# Patient Record
Sex: Male | Born: 2000 | Race: White | Hispanic: No | Marital: Single | Smoking: Never smoker
Health system: Southern US, Community
[De-identification: ages and names within clinical notes are randomized; demographics above are authoritative.]

## PROBLEM LIST (undated history)

## (undated) DIAGNOSIS — T7840XA Allergy, unspecified, initial encounter: Secondary | ICD-10-CM

## (undated) HISTORY — DX: Allergy, unspecified, initial encounter: T78.40XA

---
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
## 2019-10-09 ENCOUNTER — Ambulatory Visit
Admission: RE | Admit: 2019-10-09 | Discharge: 2019-10-09 | Disposition: A | Discharge status: Home / Self Care | Payer: 59 | Source: Ambulatory Visit | Attending: Family Medicine | Admitting: Family Medicine

## 2019-10-09 ENCOUNTER — Ambulatory Visit: Payer: 59 | Admitting: Family Medicine

## 2019-10-09 ENCOUNTER — Other Ambulatory Visit: Payer: Self-pay

## 2019-10-09 VITALS — BP 122/74 | Ht 75.0 in | Wt 210.0 lb

## 2019-10-09 DIAGNOSIS — M25512 Pain in left shoulder: Secondary | ICD-10-CM

## 2019-10-09 NOTE — Patient Instructions (Signed)
You at minimum subluxed (near-dislocated) your left shoulder and bruised the labrum. Get x-rays after today's visit - if you have a chipped bone that would change our management of this. Given it's your non-pitching arm, if x-rays are normal I would recommend you do 4-6 weeks of rehab with a labrum tear protocol back at school. I'd expect after this you should have no instability and the sharp pains you're getting should be gone. Icing, tylenol or aleve only if you need them. Let me know how you're doing in about a month (you can send me a message through [place] given you'll be in [place]).

## 2019-10-10 ENCOUNTER — Encounter: Payer: Self-pay | Admitting: Family Medicine

## 2019-10-10 NOTE — Progress Notes (Signed)
PCP: Sydell Axon, MD  Subjective:   HPI: Patient is a 18 y.o. male here for left shoulder injury.  Patient reports about 10 days ago he was at [employer] golf with his friends. He reports he was swinging for distance when he felt like his left shoulder popped out of place. This is not happened to him before but he was with a friend who has had a history of shoulder dislocations who helped him pop it back in. Since that time he has improved but still gets shooting pains at times with certain movements including reaching overhead. He is not taking any medications for this. 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 No skin changes or swelling.  No bruising. Pain is currently 0 out of 10 at rest.  Past Medical History:  Diagnosis Date  . Allergy     No current outpatient medications on file prior to visit.   No current facility-administered medications on file prior to visit.    History reviewed. No pertinent surgical history.  Allergies  Allergen Reactions  . Fish-Derived Products Hives  . Lac Bovis Other (See Comments)    Social History   Socioeconomic History  . Marital status: Single    Spouse name: Not on file  . Number of children: Not on file  . Years of education: Not on file  . Highest education level: Not on file  Occupational History  . Not on file  Tobacco Use  . Smoking status: Never Smoker  . Smokeless tobacco: Never Used  Substance and Sexual Activity  . Alcohol use: No    Alcohol/week: 0.0 standard drinks  . Drug use: No  . Sexual activity: Not on file  Other Topics Concern  . Not on file  Social History Narrative  . Not on file   Social Determinants of Health   Financial Resource Strain:   . Difficulty of Paying Living Expenses: Not on file  Food Insecurity:   . Worried About Charity fundraiser in the Last Year: Not on file  . Ran Out of Food in the Last Year: Not on file  Transportation Needs:   . Lack of Transportation  (Medical): Not on file  . Lack of Transportation (Non-Medical): Not on file  Physical Activity:   . Days of Exercise per Week: Not on file  . Minutes of Exercise per Session: Not on file  Stress:   . Feeling of Stress : Not on file  Social Connections:   . Frequency of Communication with Friends and Family: Not on file  . Frequency of Social Gatherings with Friends and Family: Not on file  . Attends Religious Services: Not on file  . Active Member of Clubs or Organizations: Not on file  . Attends Archivist Meetings: Not on file  . Marital Status: Not on file  Intimate Partner Violence:   . Fear of Current or Ex-Partner: Not on file  . Emotionally Abused: Not on file  . Physically Abused: Not on file  . Sexually Abused: Not on file    History reviewed. No pertinent family history.  BP 122/74   Ht 6\' 3"  (1.905 m)   Wt 210 lb (95.3 kg)   BMI 26.25 kg/m   Review of Systems: See HPI above.     Objective:  Physical Exam:  Gen: NAD, comfortable in exam room  Left shoulder: No swelling, ecchymoses.  No gross deformity. No TTP. FROM with mild pain at full extent of flexion and  abduction. Negative Hawkins, Neers. Negative Yergasons. Strength 5/5 with empty can and resisted internal/external rotation. Minimal pain with apprehension. Negative sulcus. Positive o'briens. NV intact distally.  Right shoulder: No swelling, ecchymoses.  No gross deformity. No TTP. FROM. Strength 5/5 with empty can and resisted internal/external rotation. NV intact distally.   Assessment & Plan:  1.  Left shoulder injury: Consistent with at least subluxation of his left shoulder and labrum contusion.  Independently reviewed radiographs which show no evidence of a bony Bankart or other bony abnormalities.  Luckily this is his nonpitching arm.  We discussed options both surgical and nonsurgical.  He has already made quite a bit of improvement over 10 days.  He will start physical  therapy when he returns to college in [place].  Advised to let us know how he is doing about 4 to 6 weeks from now and if not improving would go ahead with an MRI arthrogram of the left shoulder.  He can take Tylenol or Aleve only if needed.

## 2019-12-25 ENCOUNTER — Telehealth: Payer: Self-pay | Admitting: Family Medicine

## 2019-12-25 NOTE — Telephone Encounter (Signed)
Spoke with patient regarding his shoulder.  He's been doing rehab with trainers but feels like shoulder is subluxing.  He actually saw [facility] physicians today and is scheduled to get MR arthrogram on 4/8.  Advised I would go ahead with this and make decision based on integrity of labrum on whether or not to do surgery now or push it to end of this season.  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

## 2019-12-25 NOTE — Telephone Encounter (Signed)
-----   Message from [facility], LAT sent at 12/22/2019  8:37 AM EST ----- Regarding: FW: vm message Contact: [phone]  ----- Message ----- From: Lizbeth Bark Sent: 12/22/2019   8:06 AM EST To: Rutha Bouchard, LAT Subject: vm message                                     Pt left a vm asking for a call back to discuss getting  PT referral for shoulder dislocation. Was seen in Dec for this issue and it continues to happen.

## 2020-09-04 IMAGING — CR DG SHOULDER 2+V*L*
3 series · 3 of 3 positions shown · non-contrast
Comparison: None.

CLINICAL DATA: Acute left shoulder pain after golf injury 10 days
ago.

EXAM:
LEFT SHOULDER - 2+ VIEW

[w shoulder ap internal left *]
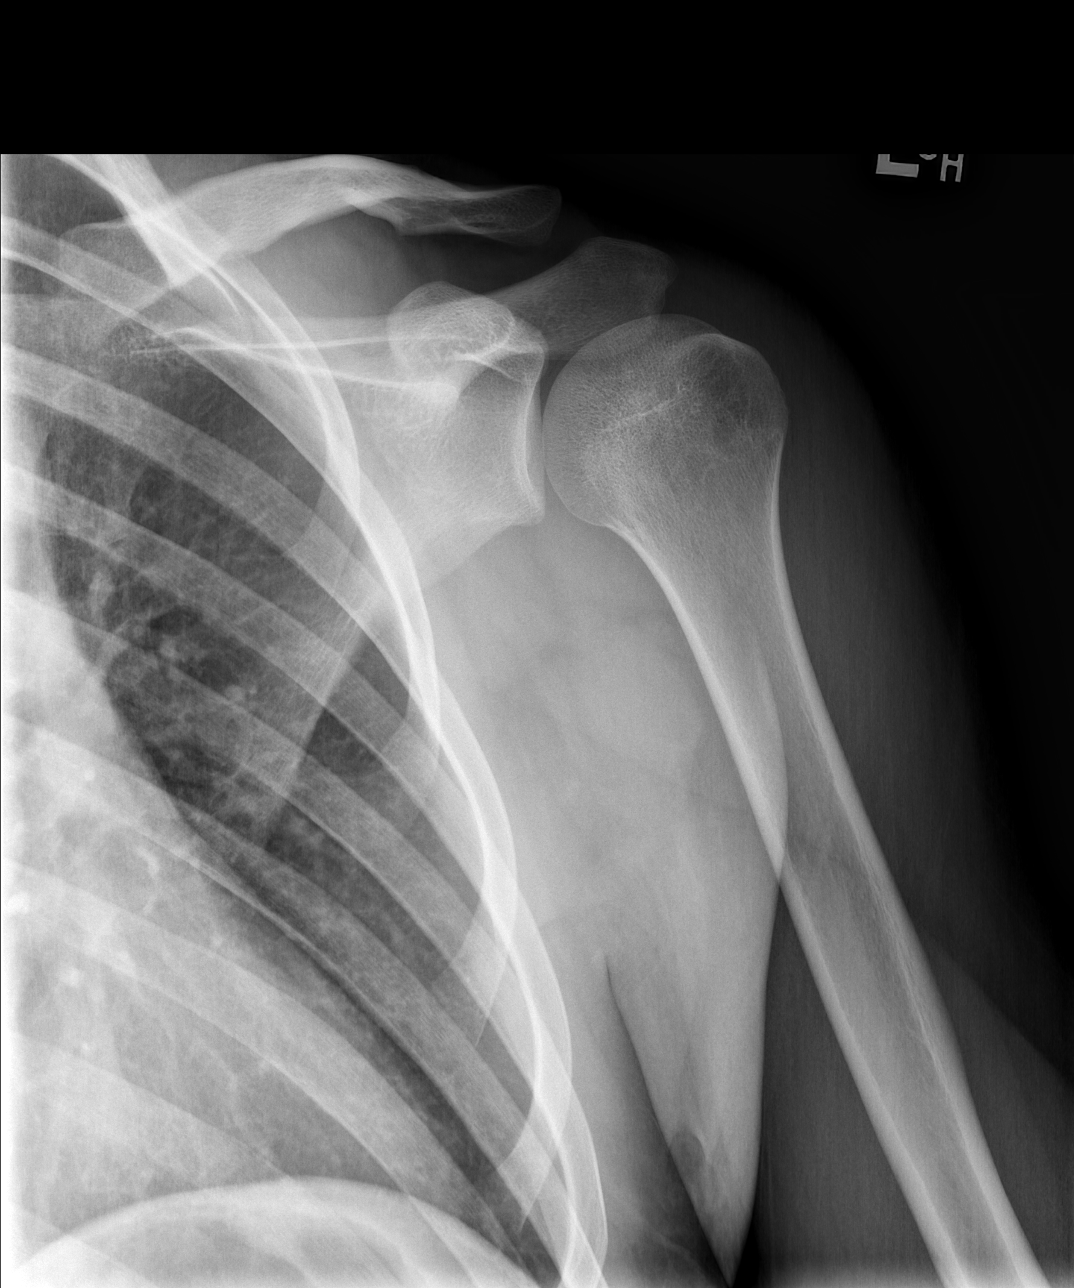

[w shoulder y view left *]
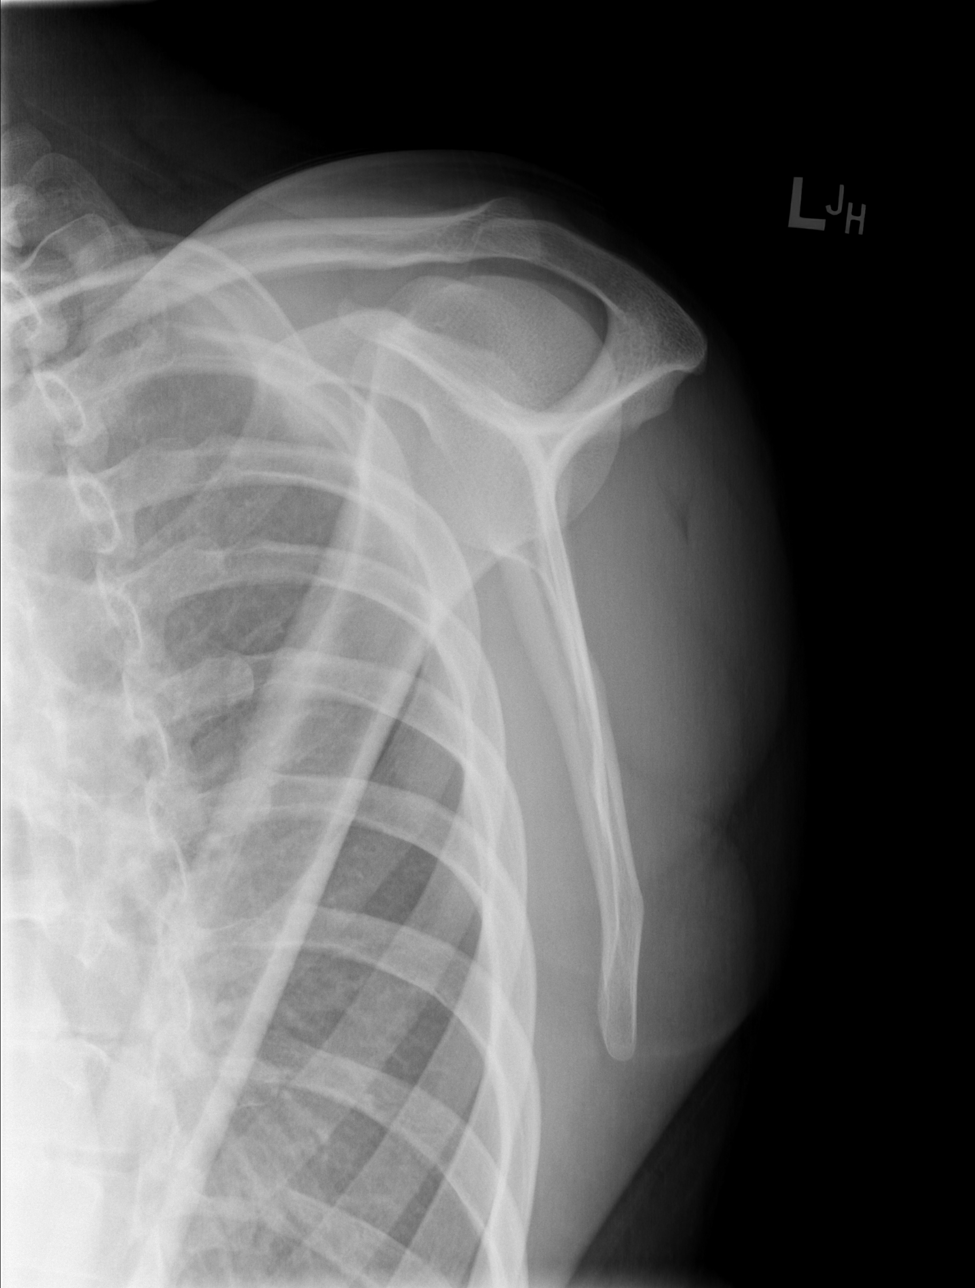

[x shoulder axillary left]
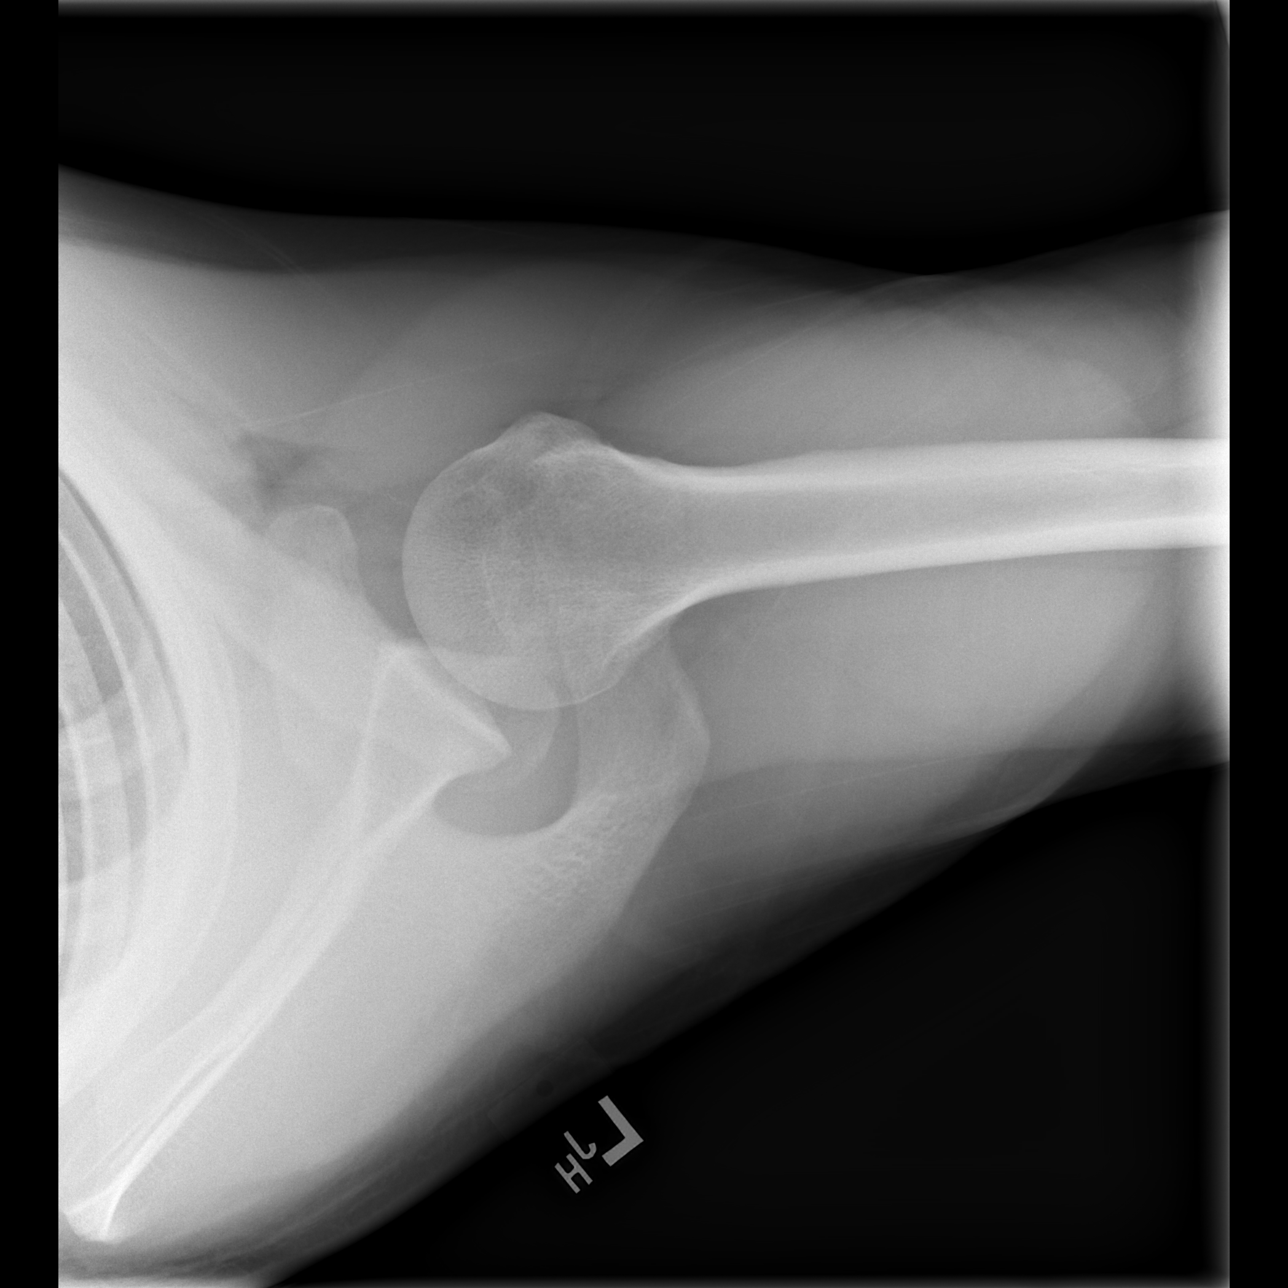

[3 of 3 positions shown; findings below may reference images not displayed]

FINDINGS: There is no evidence of fracture or dislocation. There is no
evidence of arthropathy or other focal bone abnormality. Soft
tissues are unremarkable.
IMPRESSION: Negative.
